# Patient Record
Sex: Male | Born: 2000 | Race: Black or African American | Hispanic: No | Marital: Single | Smoking: Never smoker
Health system: Southern US, Community
[De-identification: ages and names within clinical notes are randomized; demographics above are authoritative.]

## PROBLEM LIST (undated history)

## (undated) DIAGNOSIS — K219 Gastro-esophageal reflux disease without esophagitis: Secondary | ICD-10-CM

## (undated) HISTORY — PX: HAND SURGERY: SHX662

---
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
## 2019-11-05 ENCOUNTER — Emergency Department (HOSPITAL_COMMUNITY)
Admission: EM | Admit: 2019-11-05 | Discharge: 2019-11-05 | Disposition: A | Discharge status: Home / Self Care | Payer: Medicaid Other | Attending: Emergency Medicine | Admitting: Emergency Medicine

## 2019-11-05 ENCOUNTER — Emergency Department (HOSPITAL_COMMUNITY): Payer: Medicaid Other

## 2019-11-05 ENCOUNTER — Encounter (HOSPITAL_COMMUNITY): Payer: Self-pay

## 2019-11-05 ENCOUNTER — Other Ambulatory Visit: Payer: Self-pay

## 2019-11-05 DIAGNOSIS — S43014A Anterior dislocation of right humerus, initial encounter: Secondary | ICD-10-CM

## 2019-11-05 DIAGNOSIS — Y9389 Activity, other specified: Secondary | ICD-10-CM | POA: Diagnosis not present

## 2019-11-05 DIAGNOSIS — Y999 Unspecified external cause status: Secondary | ICD-10-CM | POA: Diagnosis not present

## 2019-11-05 DIAGNOSIS — Y929 Unspecified place or not applicable: Secondary | ICD-10-CM | POA: Insufficient documentation

## 2019-11-05 DIAGNOSIS — X509XXA Other and unspecified overexertion or strenuous movements or postures, initial encounter: Secondary | ICD-10-CM | POA: Diagnosis not present

## 2019-11-05 DIAGNOSIS — Z79899 Other long term (current) drug therapy: Secondary | ICD-10-CM | POA: Insufficient documentation

## 2019-11-05 DIAGNOSIS — S4991XA Unspecified injury of right shoulder and upper arm, initial encounter: Secondary | ICD-10-CM | POA: Diagnosis present

## 2019-11-05 MED ORDER — METHOCARBAMOL 500 MG PO TABS: 500.0000 mg | ORAL_TABLET | Freq: Two times a day (BID) | ORAL | 0 refills | Status: AC

## 2019-11-05 MED ORDER — OXYCODONE-ACETAMINOPHEN 5-325 MG PO TABS
1.0000 | ORAL_TABLET | ORAL | Status: DC | PRN
  Administered 2019-11-05: 1 via ORAL
  Filled 2019-11-05: qty 1

## 2019-11-05 NOTE — Discharge Instructions (Addendum)
Antiinflammatory medications: Take 600 mg of ibuprofen every 6 hours or 440 mg (over the counter dose) to 500 mg (prescription dose) of naproxen every 12 hours for the next 3 days. After this time, these medications may be used as needed for pain. Take these medications with food to avoid upset stomach. Choose only one of these medications, do not take them together. Acetaminophen (generic for Tylenol): Should you continue to have additional pain while taking the ibuprofen or naproxen, you may add in acetaminophen as needed. Your daily total maximum amount of acetaminophen from all sources should be limited to 4000mg /day for persons without liver problems, or 2000mg /day for those with liver problems. Methocarbamol: Methocarbamol (generic for Robaxin) is a muscle relaxer and can help relieve stiff muscles or muscle spasms.  Do not drive or perform other dangerous activities while taking this medication as it can cause drowsiness as well as changes in reaction time and judgement.  Follow-up: Follow-up with your orthopedic surgeon as soon as possible in this manner.  Call the office tomorrow morning.  Return: Return to the emergency department for numbness, weakness, redislocation, or any other major concerns.

## 2019-11-05 NOTE — ED Notes (Signed)
Patient verbalizes understanding of discharge instructions. Opportunity for questioning and answers were provided. Armband removed by staff, pt discharged from ED. Pt. ambulatory and discharged home.  

## 2019-11-05 NOTE — ED Provider Notes (Addendum)
[facility] Note   CSN: [id number] Arrival date & time: 11/05/19  1815     History   Chief Complaint Chief Complaint  Patient presents with  . Shoulder Pain    HPI Sylvia Wynona Neat Heikkila is a 18 y.o. male.     HPI   Asael Wynona Neat Norgard is a 18 y.o. male, with a history of right shoulder arthroscopy with Bankart repair, presenting to the ED with right shoulder pain.  He states he suspects his shoulder is dislocated.  This occurred around 5 PM this evening.  He was "slapping something out of the air in the shoulder got stuck." Pain is moderate, aching, nonradiating. Denies falls, numbness, weakness distally in the upper extremity, neck pain, chest pain, shortness of breath, or any other complaints.  Past Medical History:  Diagnosis Date  . Acid reflux     There are no active problems to display for this patient.   Past Surgical History:  Procedure Laterality Date  . HAND SURGERY Right    fracture  . SHOULDER ARTHROSCOPY WITH BANKART REPAIR Right 03/22/2018   Procedure: RIGHT SHOULDER ARTHROSCOPY WITH BANKART REPAIR;  Surgeon: Francena HanlySupple, Kevin, MD;  Location: [facility];  Service: Orthopedics;  Laterality: Right;        Home Medications    Prior to Admission medications   Medication Sig Start Date End Date Taking? Authorizing Provider  cyclobenzaprine (FLEXERIL) 10 MG tablet Take 1 tablet (10 mg total) by mouth 3 (three) times daily as needed for muscle spasms. 03/22/18   Shuford, French Anaracy, PA-C  methocarbamol (ROBAXIN) 500 MG tablet Take 1 tablet (500 mg total) by mouth 2 (two) times daily. 11/05/19   Joy, Shawn C, PA-C  naproxen (NAPROSYN) 500 MG tablet Take 1 tablet (500 mg total) by mouth 2 (two) times daily with a meal. 03/22/18   Shuford, French Anaracy, PA-C  omeprazole (PRILOSEC) 20 MG capsule Take 1 capsule (20 mg total) by mouth daily. 02/25/18   Georgetta HaberBurky, Natalie B, NP  oxyCODONE-acetaminophen (PERCOCET) 5-325 MG tablet Take 1 tablet by mouth every  4 (four) hours as needed. 03/22/18   Shuford, French Anaracy, PA-C    Family History No family history on file.  Social History Social History   Tobacco Use  . Smoking status: Never Smoker  . Smokeless tobacco: Never Used  Substance Use Topics  . Alcohol use: Not on file  . Drug use: Not on file     Allergies   Patient has no known allergies.   Review of Systems Review of Systems  Respiratory: Negative for shortness of breath.   Cardiovascular: Negative for chest pain.  Musculoskeletal: Positive for arthralgias. Negative for neck pain.  Neurological: Negative for weakness and numbness.     Physical Exam Updated Vital Signs BP 137/85 (BP Location: Left Arm)   Pulse 69   Temp 98.5 F (36.9 C) (Oral)   Resp 18   Ht 6' (1.829 m)   Wt 78.9 kg   SpO2 100%   BMI 23.60 kg/m   Physical Exam Vitals signs and nursing note reviewed.  Constitutional:      General: He is not in acute distress.    Appearance: He is well-developed. He is not diaphoretic.  HENT:     Head: Normocephalic and atraumatic.  Eyes:     Conjunctiva/sclera: Conjunctivae normal.  Neck:     Musculoskeletal: Neck supple.  Cardiovascular:     Rate and Rhythm: Normal rate and regular rhythm.  Pulses:          Radial pulses are 2+ on the right side.  Pulmonary:     Effort: Pulmonary effort is normal.  Musculoskeletal:     Comments: Patient is holding his right arm up in the air with his shoulder abducted 90 degrees.  He has deformity noted in the right shoulder consistent with shoulder dislocation. He has full range of motion in the right elbow and wrist without difficulty or pain.   Skin:    General: Skin is warm and dry.     Capillary Refill: Capillary refill takes less than 2 seconds.     Coloration: Skin is not pale.  Neurological:     Mental Status: He is alert.     Comments: Sensation to light touch grossly intact in the right upper extremity down into the fingers. Grip strength equal  bilaterally. Strength 5/5 in the right wrist and elbow.  Neurologic testing reperformed after reduction and continues to be neurologically intact.  Psychiatric:        Behavior: Behavior normal.      ED Treatments / Results  Labs (all labs ordered are listed, but only abnormal results are displayed) Labs Reviewed - No data to display  EKG None  Radiology Dg Shoulder Right  Result Date: 11/05/2019 CLINICAL DATA:  Post reduction following dislocation of the right shoulder. EXAM: RIGHT SHOULDER - 2+ VIEW COMPARISON:  11/05/2019 FINDINGS: Right shoulder is now located. There is irregularity of the inferior rim of the glenoid acromioclavicular joint is intact. IMPRESSION: Right shoulder is now located. Irregularity of the inferior glenoid is consistent with mildly displaced glenoid fracture, CT may be helpful for further assessment as clinically indicated. Electronically Signed   By: Donzetta Kohut M.D.   On: 11/05/2019 22:05   Dg Shoulder Right  Result Date: 11/05/2019 CLINICAL DATA:  Possible dislocation EXAM: RIGHT SHOULDER - 2+ VIEW COMPARISON:  None. FINDINGS: There is an anterior inferior dislocation of the humeral head. There is a cystic lucency seen in the inferior glenoid surface which could be from a prior Bankart lesion. Overlying soft tissue swelling. The [facility] joint appears to be intact. IMPRESSION: Anterior inferior dislocation of the humeral head. Electronically Signed   By: Jonna Clark M.D.   On: 11/05/2019 19:36    Procedures Reduction of dislocation  Date/Time: 11/05/2019 9:28 PM Performed by: Anselm Pancoast, PA-C Authorized by: Anselm Pancoast, PA-C  Consent: Verbal consent obtained. Risks and benefits: risks, benefits and alternatives were discussed Consent given by: patient Patient understanding: patient states understanding of the procedure being performed Patient identity confirmed: verbally with patient and provided demographic data Local anesthesia used: no   Anesthesia: Local anesthesia used: no  Sedation: Patient sedated: no  Patient tolerance: patient tolerated the procedure well with no immediate complications Comments: Right shoulder reduction    (including critical care time)  Medications Ordered in ED Medications  oxyCODONE-acetaminophen (PERCOCET/ROXICET) 5-325 MG per tablet 1 tablet (1 tablet Oral Given 11/05/19 1845)     Initial Impression / Assessment and Plan / ED Course  I have reviewed the triage vital signs and the nursing notes.  Pertinent labs & imaging results that were available during my care of the patient were reviewed by me and considered in my medical decision making (see chart for details).  Clinical Course as of Nov 04 2328  Tue Nov 05, 2019  2055 Patient not yet in the room.   [SJ]  2210 Spoke with Dr. Victorino Dike, orthopedic  surgeon on call.  We discussed patient's presentation and suspicion for possible glenoid fracture on postreduction x-ray.  Dr. Doran Durand reviewed the x-ray himself and states the noted findings are more consistent with postsurgical changes.  He advises holding off on CT at this time and have patient follow-up in the office with Dr. Onnie Graham.   [SJ]    Clinical Course User Index [SJ] Joy, Shawn C, PA-C       Patient presents with right shoulder pain.  No evidence of neurovascular compromise.  Found to have anterior shoulder dislocation. Patient given the option for further pain management prior to reduction, but declined. Dislocation reduced at the bedside without immediate complication, confirmed successful reduction with follow-up x-ray. Orthopedic follow-up. The patient was given instructions for home care as well as return precautions. Patient voices understanding of these instructions, accepts the plan, and is comfortable with discharge.  Findings and plan of care discussed with Shirlyn Goltz, MD.   Final Clinical Impressions(s) / ED Diagnoses   Final diagnoses:  Anterior dislocation of  right shoulder, initial encounter    ED Discharge Orders         Ordered    methocarbamol (ROBAXIN) 500 MG tablet  2 times daily     11/05/19 2217           Layla Maw 11/05/19 2338    Lorayne Bender, PA-C 11/05/19 2338    Drenda Freeze, MD 11/06/19 [phone]

## 2019-11-05 NOTE — ED Triage Notes (Signed)
Pt reports right shoulder dislocation from raising his arm up, hx of shoulder surgery in aug of this year due to dislocation, pt unable to lay arm down due to the pain.

## 2021-04-10 IMAGING — DX DG SHOULDER 2+V*R*
2 series · 2 of 2 positions shown · non-contrast
Comparison: 11/05/2019

CLINICAL DATA: Post reduction following dislocation of the right
shoulder.

EXAM:
RIGHT SHOULDER - 2+ VIEW

[shoulder y view]
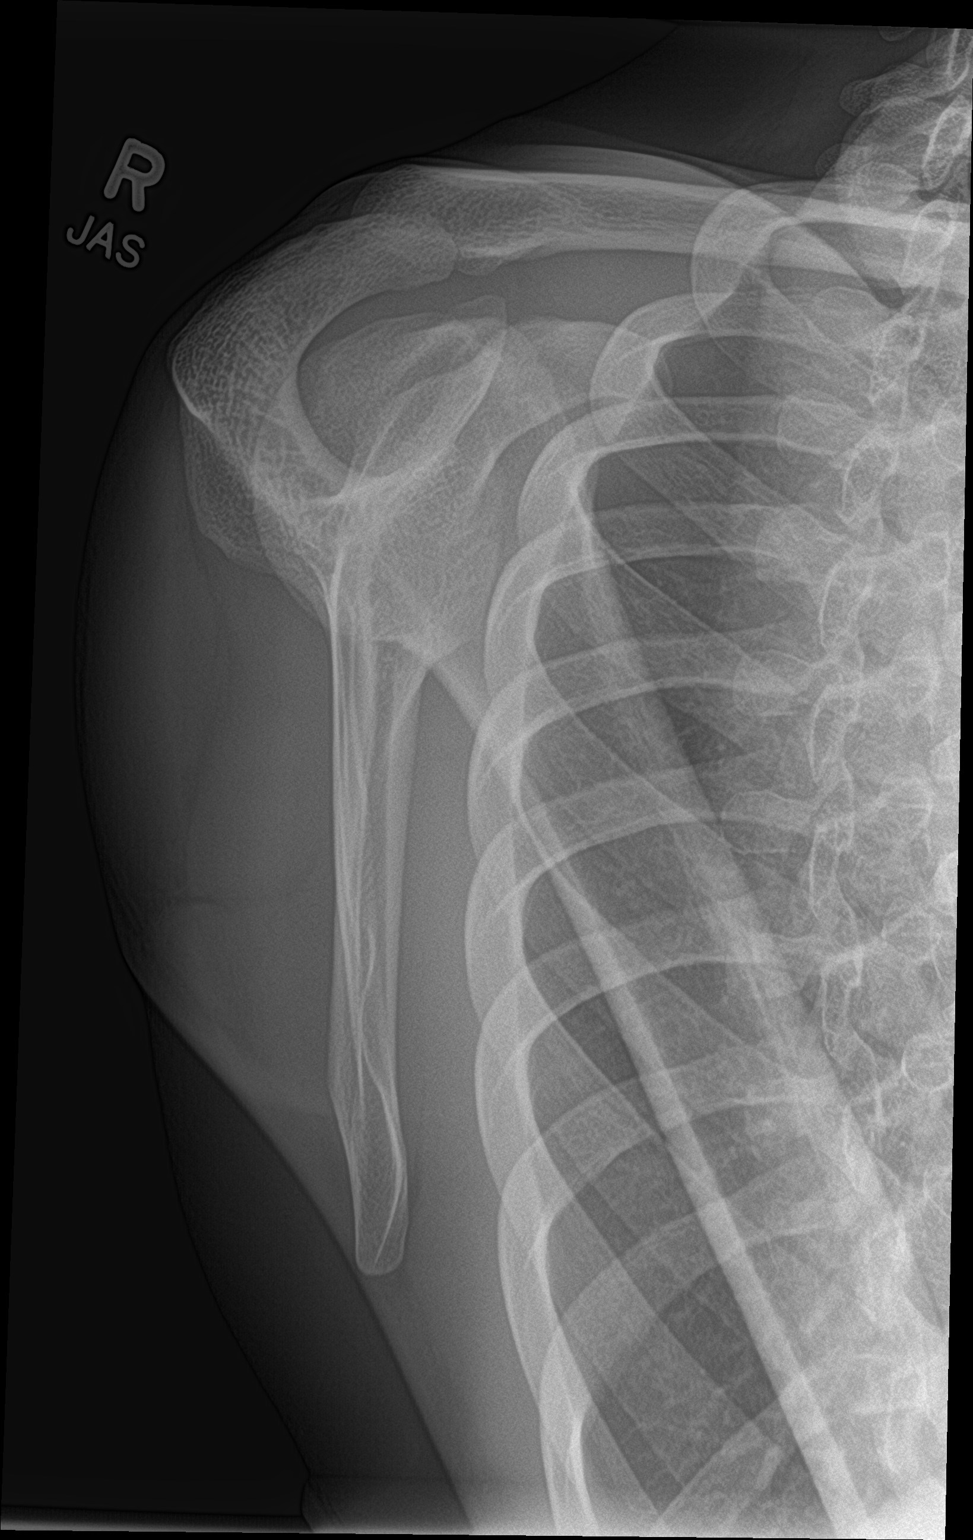

[shoulder ap neutral]
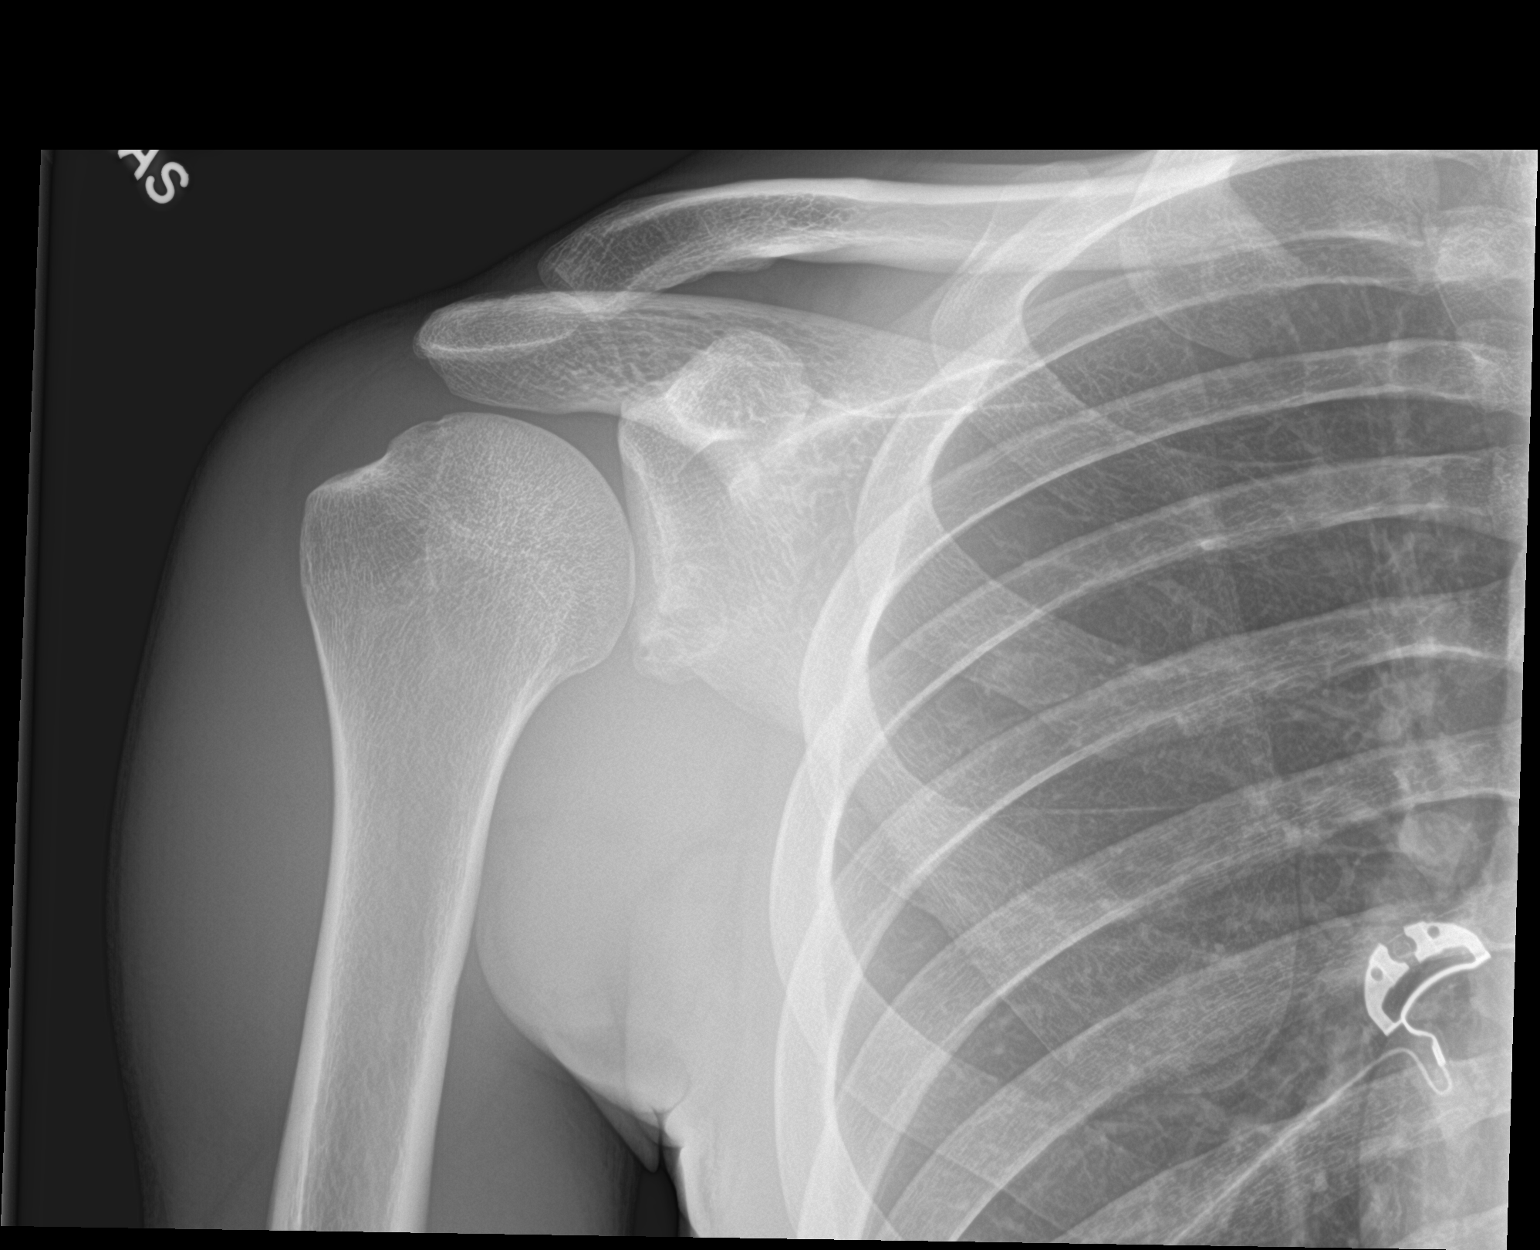

[2 of 2 positions shown; findings below may reference images not displayed]

FINDINGS: Right shoulder is now located. There is irregularity of the inferior
rim of the glenoid acromioclavicular joint is intact.
IMPRESSION: Right shoulder is now located. Irregularity of the inferior glenoid
is consistent with mildly displaced glenoid fracture, CT may be
helpful for further assessment as clinically indicated.

## 2021-04-10 IMAGING — DX DG SHOULDER 2+V*R*
2 series · 2 of 2 positions shown · non-contrast
Comparison: None.

CLINICAL DATA: Possible dislocation

EXAM:
RIGHT SHOULDER - 2+ VIEW

[shoulder grashey]
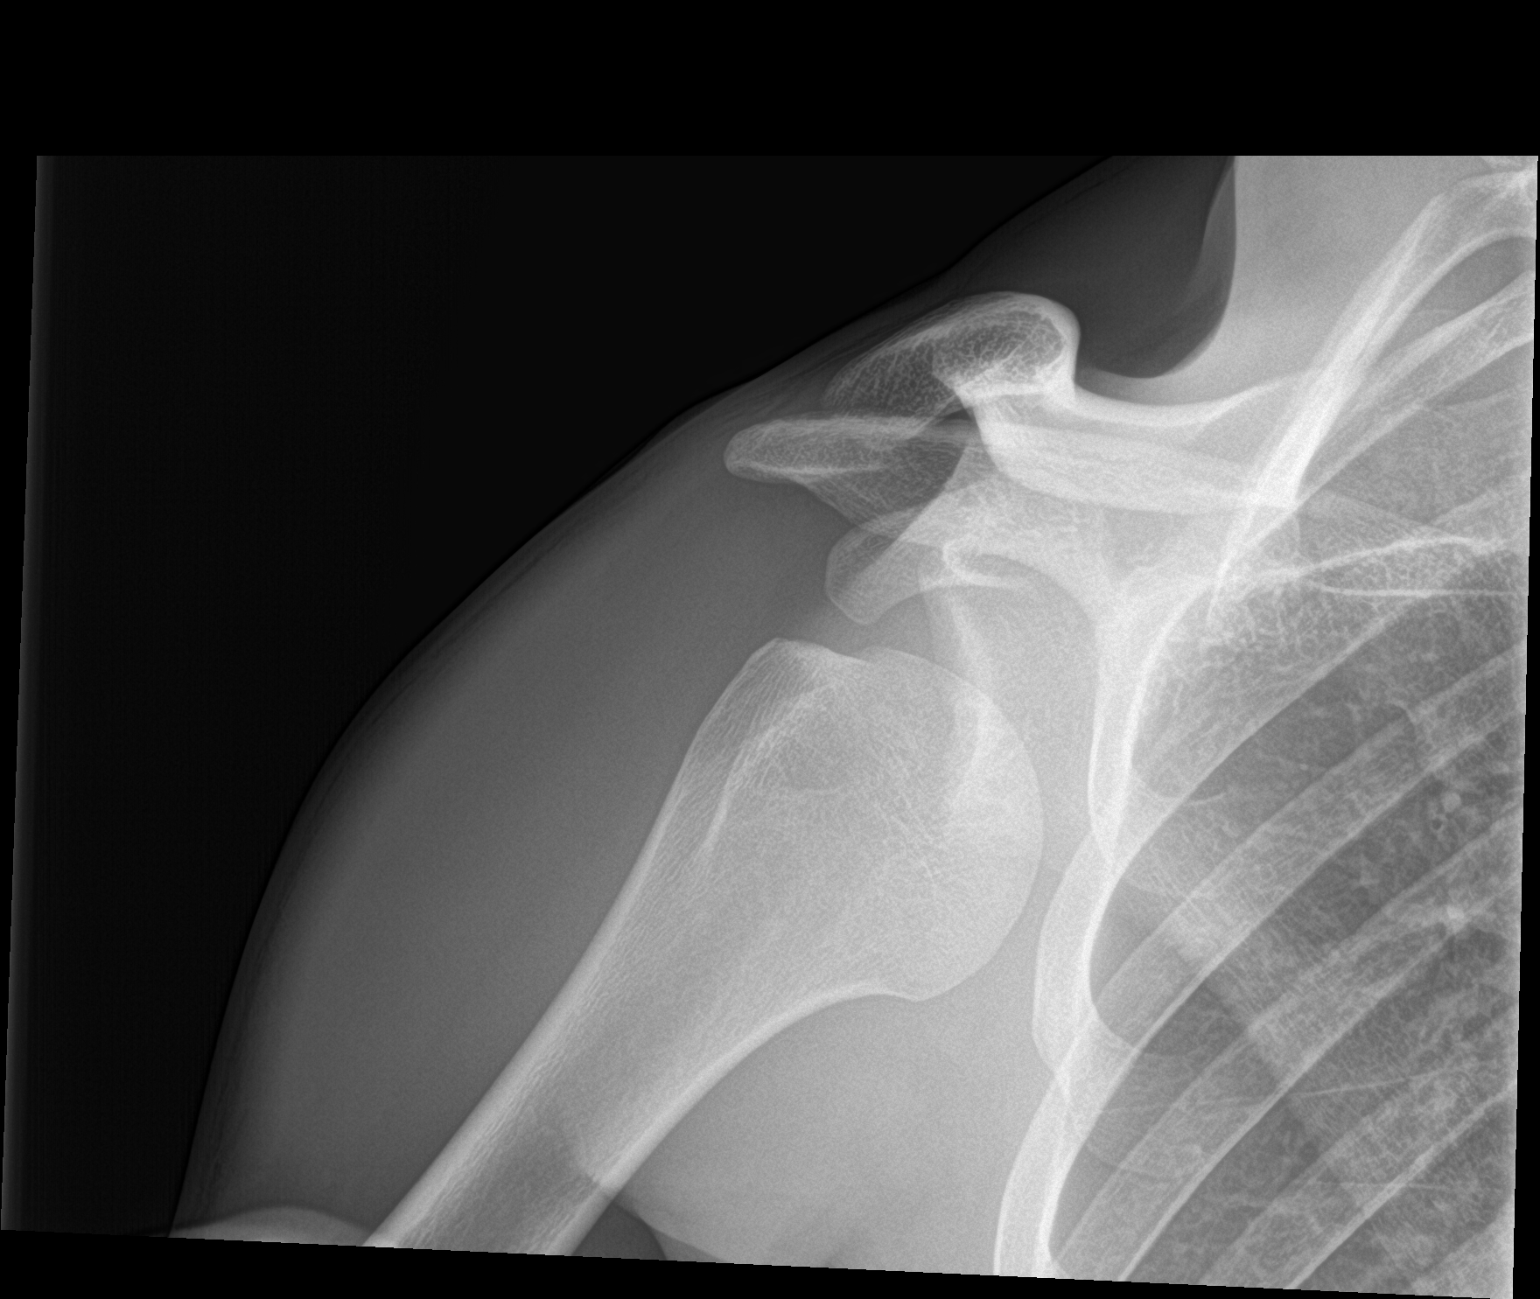

[shoulder y view]
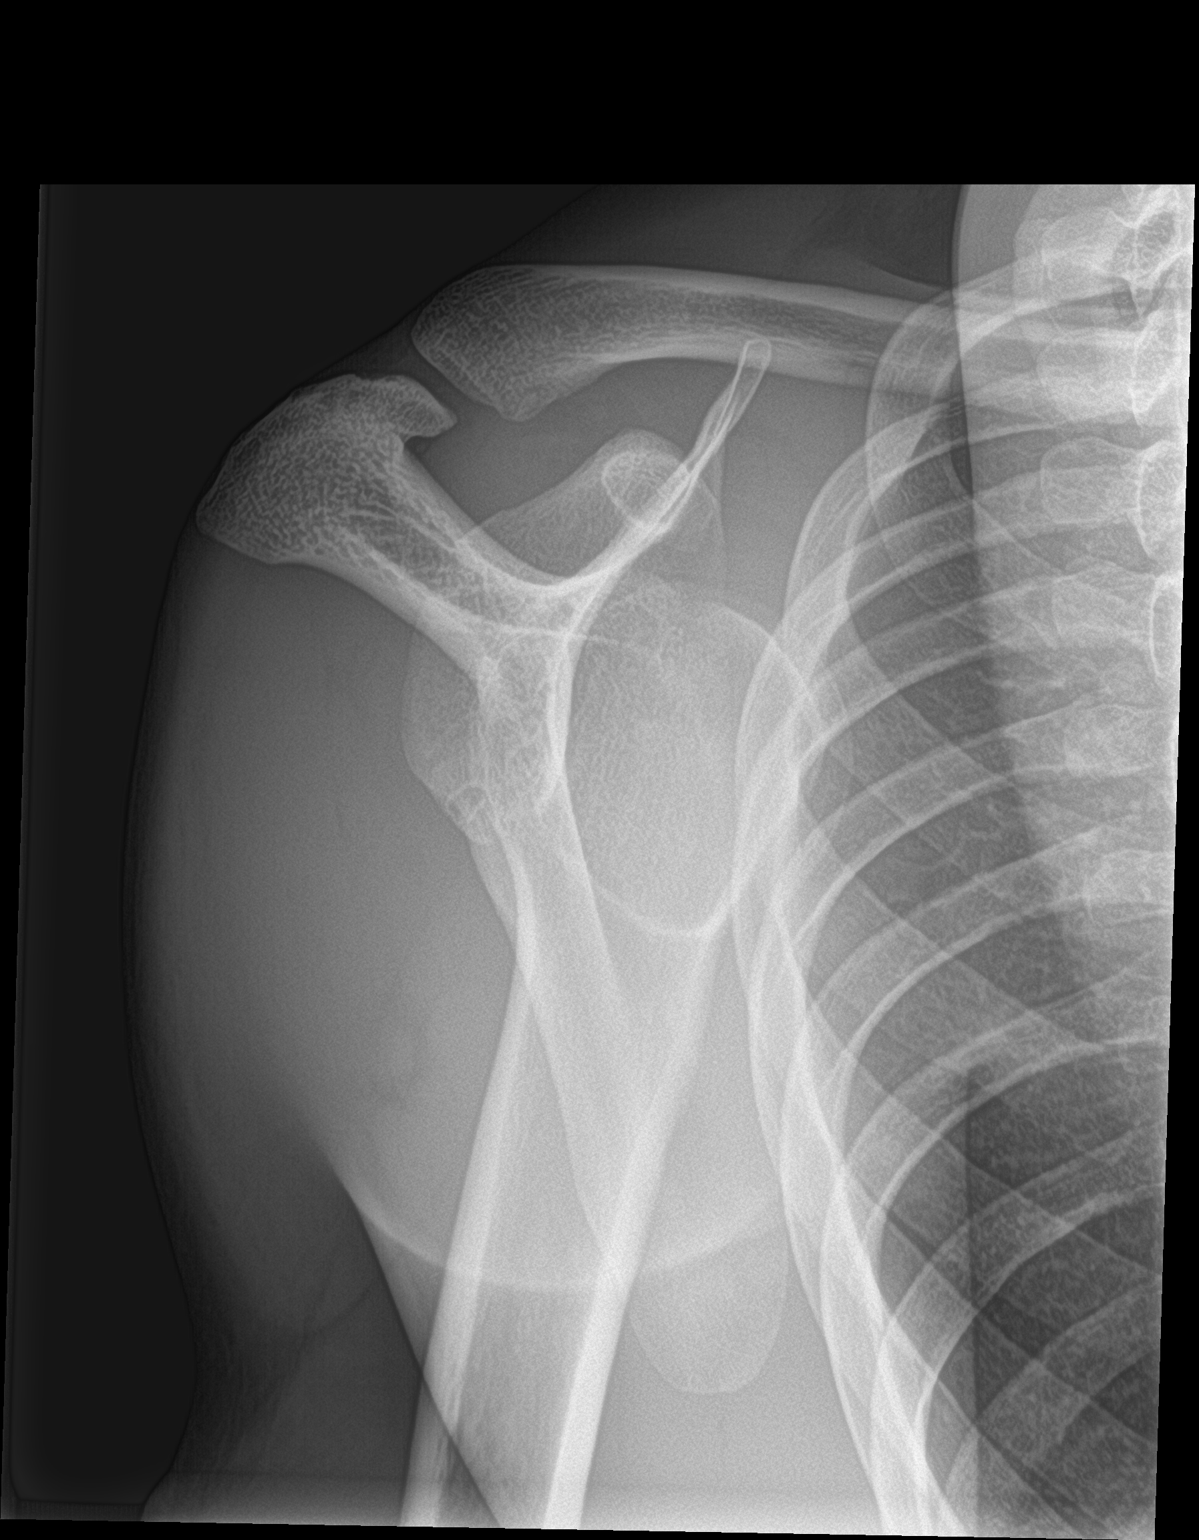

[2 of 2 positions shown; findings below may reference images not displayed]

FINDINGS: There is an anterior inferior dislocation of the humeral head. There
is a cystic lucency seen in the inferior glenoid surface which could
be from a prior Bankart lesion. Overlying soft tissue swelling. The
AC joint appears to be intact.
IMPRESSION: Anterior inferior dislocation of the humeral head.

## 2022-07-29 ENCOUNTER — Ambulatory Visit (HOSPITAL_COMMUNITY)
Admission: EM | Admit: 2022-07-29 | Discharge: 2022-07-29 | Disposition: A | Discharge status: Home / Self Care | Payer: Medicaid Other | Attending: Family Medicine | Admitting: Family Medicine

## 2022-07-29 ENCOUNTER — Encounter (HOSPITAL_COMMUNITY): Payer: Self-pay

## 2022-07-29 DIAGNOSIS — R369 Urethral discharge, unspecified: Secondary | ICD-10-CM | POA: Diagnosis present

## 2022-07-29 DIAGNOSIS — Z113 Encounter for screening for infections with a predominantly sexual mode of transmission: Secondary | ICD-10-CM

## 2022-07-29 LAB — POCT URINALYSIS DIPSTICK, ED / UC
Glucose, UA: NEGATIVE mg/dL
Ketones, ur: >=160 mg/dL — AB
Nitrite: NEGATIVE
Protein, ur: 100 mg/dL — AB
Specific Gravity, Urine: >=1.03 (ref 1.005–1.030)
Urobilinogen, UA: 1 mg/dL (ref 0.0–1.0)
pH: 5.5 (ref 5.0–8.0)

## 2022-07-29 LAB — HEPATITIS C ANTIBODY: HCV Ab: NONREACTIVE

## 2022-07-29 LAB — HIV ANTIBODY (ROUTINE TESTING W REFLEX): HIV Screen 4th Generation wRfx: NONREACTIVE

## 2022-07-29 MED ORDER — CEFTRIAXONE SODIUM 500 MG IJ SOLR
500.0000 mg | Freq: Once | INTRAMUSCULAR | Status: AC
  Administered 2022-07-29: 500 mg via INTRAMUSCULAR

## 2022-07-29 MED ORDER — DOXYCYCLINE HYCLATE 100 MG PO CAPS: 100.0000 mg | ORAL_CAPSULE | Freq: Two times a day (BID) | ORAL | 0 refills | Status: AC

## 2022-07-29 MED ORDER — LIDOCAINE HCL (PF) 1 % IJ SOLN
INTRAMUSCULAR | Status: AC
  Filled 2022-07-29: qty 2

## 2022-07-29 MED ORDER — CEFTRIAXONE SODIUM 500 MG IJ SOLR
INTRAMUSCULAR | Status: AC
  Filled 2022-07-29: qty 500

## 2022-07-29 NOTE — ED Triage Notes (Signed)
Pt has been having discharge from the penis color white x4days. Mild burning sensation when urinating.

## 2022-07-29 NOTE — Discharge Instructions (Signed)
You were seen today for penile discharge.  You were treated today with a shot of rocephin, and sent out antibiotic twice/day x 7 days.  Your swab and lab work will be resulted tomorrow.  Please avoid intercourse for a week while on treatment.  Please discuss you results with your partner when available.

## 2022-07-29 NOTE — ED Provider Notes (Signed)
[facility]    CSN: [id number] Arrival date & time: 07/29/22  0935      History   Chief Complaint Chief Complaint  Patient presents with   Penile Discharge    HPI Bobby Hansen is a 21 y.o. male.   Patient is here for white penile d/c for at least 4 days.  Slight painful urination.  No abd pain, no frequency.  No fevers/chills.  Sexually active 1 partner.   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  No sores/lesions noted.   Past Medical History:  Diagnosis Date   Acid reflux     There are no problems to display for this patient.   Past Surgical History:  Procedure Laterality Date   HAND SURGERY Right    fracture   SHOULDER ARTHROSCOPY WITH BANKART REPAIR Right 03/22/2018   Procedure: RIGHT SHOULDER ARTHROSCOPY WITH BANKART REPAIR;  Surgeon: Francena Hanly, MD;  Location: [facility] OR;  Service: Orthopedics;  Laterality: Right;       Home Medications    Prior to Admission medications   Medication Sig Start Date End Date Taking? Authorizing Provider  cyclobenzaprine (FLEXERIL) 10 MG tablet Take 1 tablet (10 mg total) by mouth 3 (three) times daily as needed for muscle spasms. 03/22/18   Shuford, French Ana, PA-C  methocarbamol (ROBAXIN) 500 MG tablet Take 1 tablet (500 mg total) by mouth 2 (two) times daily. 11/05/19   Joy, Shawn C, PA-C  naproxen (NAPROSYN) 500 MG tablet Take 1 tablet (500 mg total) by mouth 2 (two) times daily with a meal. 03/22/18   Shuford, French Ana, PA-C  omeprazole (PRILOSEC) 20 MG capsule Take 1 capsule (20 mg total) by mouth daily. 02/25/18   Georgetta Haber, NP  oxyCODONE-acetaminophen (PERCOCET) 5-325 MG tablet Take 1 tablet by mouth every 4 (four) hours as needed. 03/22/18   Shuford, French Ana, PA-C    Family History History reviewed. No pertinent family history.  Social History Social History   Tobacco Use   Smoking status: Never   Smokeless tobacco: Never     Allergies   Patient has no known allergies.   Review of  Systems Review of Systems  Constitutional: Negative.   HENT: Negative.    Respiratory: Negative.    Cardiovascular: Negative.   Gastrointestinal: Negative.   Genitourinary:  Positive for penile discharge. Negative for dysuria, frequency, testicular pain and urgency.     Physical Exam Triage Vital Signs ED Triage Vitals  Enc Vitals Group     BP 07/29/22 0945 134/80     Pulse Rate 07/29/22 0945 77     Resp 07/29/22 0945 12     Temp 07/29/22 0945 98 F (36.7 C)     Temp Source 07/29/22 0945 Oral     SpO2 --      Weight 07/29/22 0947 175 lb (79.4 kg)     Height 07/29/22 0947 6' (1.829 m)     Head Circumference --      Peak Flow --      Pain Score 07/29/22 0947 0     Pain Loc --      Pain Edu? --      Excl. in GC? --    No data found.  Updated Vital Signs BP 134/80 (BP Location: Left Arm)   Pulse 77   Temp 98 F (36.7 C) (Oral)   Resp 12   Ht 6' (1.829 m)   Wt 79.4 kg   BMI 23.73 kg/m   Visual Acuity  Right Eye Distance:   Left Eye Distance:   Bilateral Distance:    Right Eye Near:   Left Eye Near:    Bilateral Near:     Physical Exam Constitutional:      Appearance: Normal appearance.  Cardiovascular:     Rate and Rhythm: Normal rate and regular rhythm.  Pulmonary:     Effort: Pulmonary effort is normal.     Breath sounds: Normal breath sounds.  Abdominal:     Palpations: Abdomen is soft.  Musculoskeletal:     Cervical back: Normal range of motion.  Neurological:     Mental Status: He is alert.      UC Treatments / Results  Labs (all labs ordered are listed, but only abnormal results are displayed) Labs Reviewed  POCT URINALYSIS DIPSTICK, ED / UC  CYTOLOGY, (ORAL, ANAL, URETHRAL) ANCILLARY ONLY    EKG   Radiology No results found.  Procedures Procedures (including critical care time)  Medications Ordered in UC Medications  cefTRIAXone (ROCEPHIN) injection 500 mg (has no administration in time range)    Initial Impression /  Assessment and Plan / UC Course  I have reviewed the triage vital signs and the nursing notes.  Pertinent labs & imaging results that were available during my care of the patient were reviewed by me and considered in my medical decision making (see chart for details).   Final Clinical Impressions(s) / UC Diagnoses   Final diagnoses:  Penile discharge  Screening examination for STD (sexually transmitted disease)     Discharge Instructions      You were seen today for penile discharge.  You were treated today with a shot of rocephin, and sent out antibiotic twice/day x 7 days.  Your swab and lab work will be resulted tomorrow.  Please avoid intercourse for a week while on treatment.  Please discuss you results with your partner when available.     ED Prescriptions     Medication Sig Dispense Auth. Provider   doxycycline (VIBRAMYCIN) 100 MG capsule Take 1 capsule (100 mg total) by mouth 2 (two) times daily for 7 days. 14 capsule Jannifer Franklin, MD      PDMP not reviewed this encounter.   Jannifer Franklin, MD 07/29/22 1008

## 2022-07-30 LAB — URINE CULTURE: Culture: NO GROWTH

## 2022-07-30 LAB — RPR: RPR Ser Ql: NONREACTIVE

## 2022-08-01 LAB — CYTOLOGY, (ORAL, ANAL, URETHRAL) ANCILLARY ONLY
Chlamydia: NEGATIVE
Comment: NEGATIVE
Comment: NEGATIVE
Comment: NORMAL
Neisseria Gonorrhea: POSITIVE — AB
Trichomonas: NEGATIVE

## 2022-11-30 ENCOUNTER — Other Ambulatory Visit: Payer: Self-pay

## 2022-11-30 ENCOUNTER — Observation Stay (HOSPITAL_COMMUNITY)
Admission: EM | Admit: 2022-11-30 | Discharge: 2022-12-02 | Disposition: A | Discharge status: Home / Self Care | Payer: Medicaid Other | Attending: Emergency Medicine | Admitting: Emergency Medicine

## 2022-11-30 ENCOUNTER — Emergency Department (HOSPITAL_COMMUNITY): Payer: Medicaid Other

## 2022-11-30 ENCOUNTER — Encounter (HOSPITAL_COMMUNITY): Payer: Self-pay

## 2022-11-30 DIAGNOSIS — T07XXXA Unspecified multiple injuries, initial encounter: Secondary | ICD-10-CM

## 2022-11-30 DIAGNOSIS — S36113A Laceration of liver, unspecified degree, initial encounter: Secondary | ICD-10-CM | POA: Insufficient documentation

## 2022-11-30 DIAGNOSIS — S22089A Unspecified fracture of T11-T12 vertebra, initial encounter for closed fracture: Secondary | ICD-10-CM | POA: Diagnosis not present

## 2022-11-30 DIAGNOSIS — S27331A Laceration of lung, unilateral, initial encounter: Principal | ICD-10-CM | POA: Insufficient documentation

## 2022-11-30 DIAGNOSIS — Z23 Encounter for immunization: Secondary | ICD-10-CM | POA: Insufficient documentation

## 2022-11-30 DIAGNOSIS — K683 Retroperitoneal hematoma: Secondary | ICD-10-CM | POA: Insufficient documentation

## 2022-11-30 DIAGNOSIS — Z79899 Other long term (current) drug therapy: Secondary | ICD-10-CM | POA: Diagnosis not present

## 2022-11-30 DIAGNOSIS — J939 Pneumothorax, unspecified: Secondary | ICD-10-CM

## 2022-11-30 LAB — PROTIME-INR
INR: 1.1 (ref 0.8–1.2)
Prothrombin Time: 13.6 seconds (ref 11.4–15.2)

## 2022-11-30 LAB — I-STAT CHEM 8, ED
BUN: 12 mg/dL (ref 6–20)
Calcium, Ion: 0.99 mmol/L — ABNORMAL LOW (ref 1.15–1.40)
Chloride: 107 mmol/L (ref 98–111)
Creatinine, Ser: 1.3 mg/dL — ABNORMAL HIGH (ref 0.61–1.24)
Glucose, Bld: 127 mg/dL — ABNORMAL HIGH (ref 70–99)
HCT: 40 % (ref 39.0–52.0)
Hemoglobin: 13.6 g/dL (ref 13.0–17.0)
Potassium: 3.1 mmol/L — ABNORMAL LOW (ref 3.5–5.1)
Sodium: 140 mmol/L (ref 135–145)
TCO2: 16 mmol/L — ABNORMAL LOW (ref 22–32)

## 2022-11-30 LAB — SAMPLE TO BLOOD BANK

## 2022-11-30 MED ORDER — SODIUM CHLORIDE 0.9 % IV SOLN
1000.0000 mL | INTRAVENOUS | Status: DC
  Administered 2022-12-01: 1000 mL via INTRAVENOUS

## 2022-11-30 MED ORDER — ONDANSETRON 4 MG PO TBDP: 4.0000 mg | ORAL_TABLET | Freq: Four times a day (QID) | ORAL | Status: DC | PRN

## 2022-11-30 MED ORDER — ENOXAPARIN SODIUM 30 MG/0.3ML IJ SOSY
30.0000 mg | PREFILLED_SYRINGE | Freq: Two times a day (BID) | INTRAMUSCULAR | Status: DC
  Administered 2022-12-01 – 2022-12-02 (×3): 30 mg via SUBCUTANEOUS
  Filled 2022-11-30 (×3): qty 0.3

## 2022-11-30 MED ORDER — SODIUM CHLORIDE 0.9 % IV BOLUS (SEPSIS)
1000.0000 mL | Freq: Once | INTRAVENOUS | Status: AC
  Administered 2022-11-30: 1000 mL via INTRAVENOUS

## 2022-11-30 MED ORDER — ONDANSETRON HCL 4 MG/2ML IJ SOLN: 4.0000 mg | Freq: Four times a day (QID) | INTRAMUSCULAR | Status: DC | PRN

## 2022-11-30 MED ORDER — SODIUM CHLORIDE 0.9 % IV SOLN: INTRAVENOUS | Status: DC

## 2022-11-30 MED ORDER — OXYCODONE HCL 5 MG PO TABS
10.0000 mg | ORAL_TABLET | ORAL | Status: DC | PRN
  Administered 2022-12-01: 10 mg via ORAL
  Filled 2022-11-30: qty 2

## 2022-11-30 MED ORDER — IOHEXOL 350 MG/ML SOLN
75.0000 mL | Freq: Once | INTRAVENOUS | Status: AC | PRN
  Administered 2022-11-30: 75 mL via INTRAVENOUS

## 2022-11-30 MED ORDER — HYDROMORPHONE HCL 1 MG/ML IJ SOLN
1.0000 mg | INTRAMUSCULAR | Status: DC | PRN
  Administered 2022-12-01: 1 mg via INTRAVENOUS

## 2022-11-30 MED ORDER — SODIUM CHLORIDE 0.9 % IV BOLUS (SEPSIS)
1000.0000 mL | Freq: Once | INTRAVENOUS | Status: AC
  Administered 2022-12-01: 1000 mL via INTRAVENOUS

## 2022-11-30 MED ORDER — TETANUS-DIPHTH-ACELL PERTUSSIS 5-2.5-18.5 LF-MCG/0.5 IM SUSY
0.5000 mL | PREFILLED_SYRINGE | Freq: Once | INTRAMUSCULAR | Status: AC
  Administered 2022-11-30: 0.5 mL via INTRAMUSCULAR
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
## 2022-11-30 NOTE — ED Notes (Signed)
The pt is alert and talking no allergies 2 ivs  x 2 lt  forearm 18 and a 16
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
## 2022-12-01 ENCOUNTER — Observation Stay (HOSPITAL_COMMUNITY): Payer: Medicaid Other

## 2022-12-01 LAB — URINALYSIS, ROUTINE W REFLEX MICROSCOPIC
Bacteria, UA: NONE SEEN
Bilirubin Urine: NEGATIVE
Glucose, UA: NEGATIVE mg/dL
Hgb urine dipstick: NEGATIVE
Ketones, ur: 5 mg/dL — AB
Leukocytes,Ua: NEGATIVE
Nitrite: NEGATIVE
Protein, ur: NEGATIVE mg/dL
Specific Gravity, Urine: 1.041 — ABNORMAL HIGH (ref 1.005–1.030)
pH: 5 (ref 5.0–8.0)

## 2022-12-01 LAB — COMPREHENSIVE METABOLIC PANEL
ALT: 38 U/L (ref 0–44)
AST: 56 U/L — ABNORMAL HIGH (ref 15–41)
Albumin: 4 g/dL (ref 3.5–5.0)
Alkaline Phosphatase: 57 U/L (ref 38–126)
Anion gap: 19 — ABNORMAL HIGH (ref 5–15)
BUN: 12 mg/dL (ref 6–20)
CO2: 16 mmol/L — ABNORMAL LOW (ref 22–32)
Calcium: 8.4 mg/dL — ABNORMAL LOW (ref 8.9–10.3)
Chloride: 106 mmol/L (ref 98–111)
Creatinine, Ser: 1.33 mg/dL — ABNORMAL HIGH (ref 0.61–1.24)
GFR, Estimated: >60 mL/min (ref >60)
Glucose, Bld: 131 mg/dL — ABNORMAL HIGH (ref 70–99)
Potassium: 3.1 mmol/L — ABNORMAL LOW (ref 3.5–5.1)
Sodium: 141 mmol/L (ref 135–145)
Total Bilirubin: 0.6 mg/dL (ref 0.3–1.2)
Total Protein: 6.4 g/dL — ABNORMAL LOW (ref 6.5–8.1)

## 2022-12-01 LAB — CBC
HCT: 38.6 % — ABNORMAL LOW (ref 39.0–52.0)
HCT: 30.8 % — ABNORMAL LOW (ref 39.0–52.0)
HCT: 29.5 % — ABNORMAL LOW (ref 39.0–52.0)
Hemoglobin: 12.8 g/dL — ABNORMAL LOW (ref 13.0–17.0)
Hemoglobin: 10.3 g/dL — ABNORMAL LOW (ref 13.0–17.0)
Hemoglobin: 9.9 g/dL — ABNORMAL LOW (ref 13.0–17.0)
MCH: 32.9 pg (ref 26.0–34.0)
MCH: 32.3 pg (ref 26.0–34.0)
MCH: 32.2 pg (ref 26.0–34.0)
MCHC: 33.2 g/dL (ref 30.0–36.0)
MCHC: 33.4 g/dL (ref 30.0–36.0)
MCHC: 33.6 g/dL (ref 30.0–36.0)
MCV: 99.2 fL (ref 80.0–100.0)
MCV: 96.6 fL (ref 80.0–100.0)
MCV: 96.1 fL (ref 80.0–100.0)
Platelets: 304 10*3/uL (ref 150–400)
Platelets: 242 10*3/uL (ref 150–400)
Platelets: 231 10*3/uL (ref 150–400)
RBC: 3.89 MIL/uL — ABNORMAL LOW (ref 4.22–5.81)
RBC: 3.19 MIL/uL — ABNORMAL LOW (ref 4.22–5.81)
RBC: 3.07 MIL/uL — ABNORMAL LOW (ref 4.22–5.81)
RDW: 12.9 % (ref 11.5–15.5)
RDW: 12.7 % (ref 11.5–15.5)
RDW: 12.6 % (ref 11.5–15.5)
WBC: 9.6 10*3/uL (ref 4.0–10.5)
WBC: 15.2 10*3/uL — ABNORMAL HIGH (ref 4.0–10.5)
WBC: 12.6 10*3/uL — ABNORMAL HIGH (ref 4.0–10.5)
nRBC: 0 % (ref 0.0–0.2)
nRBC: 0 % (ref 0.0–0.2)
nRBC: 0 % (ref 0.0–0.2)

## 2022-12-01 LAB — ETHANOL: Alcohol, Ethyl (B): 145 mg/dL — ABNORMAL HIGH (ref <10)

## 2022-12-01 LAB — LACTIC ACID, PLASMA: Lactic Acid, Venous: 8.1 mmol/L (ref 0.5–1.9)

## 2022-12-01 LAB — HIV ANTIBODY (ROUTINE TESTING W REFLEX): HIV Screen 4th Generation wRfx: NONREACTIVE

## 2022-12-01 MED ORDER — HYDROMORPHONE HCL 1 MG/ML IJ SOLN: 1.0000 mg | INTRAMUSCULAR | Status: DC | PRN

## 2022-12-01 MED ORDER — OXYCODONE HCL 5 MG PO TABS
5.0000 mg | ORAL_TABLET | ORAL | Status: DC | PRN
  Administered 2022-12-01 – 2022-12-02 (×2): 10 mg via ORAL
  Administered 2022-12-02: 5 mg via ORAL
  Filled 2022-12-01: qty 1
  Filled 2022-12-01 (×2): qty 2

## 2022-12-01 MED ORDER — HYDROMORPHONE HCL 1 MG/ML IJ SOLN
INTRAMUSCULAR | Status: AC
  Filled 2022-12-01: qty 1

## 2022-12-01 MED ORDER — IOHEXOL 350 MG/ML SOLN
75.0000 mL | Freq: Once | INTRAVENOUS | Status: AC | PRN
  Administered 2022-12-01: 75 mL via INTRAVENOUS

## 2022-12-01 MED ORDER — ACETAMINOPHEN 325 MG PO TABS
650.0000 mg | ORAL_TABLET | Freq: Four times a day (QID) | ORAL | Status: DC
  Administered 2022-12-01 – 2022-12-02 (×6): 650 mg via ORAL
  Filled 2022-12-01 (×6): qty 2

## 2022-12-01 MED ORDER — LIDOCAINE-EPINEPHRINE (PF) 2 %-1:200000 IJ SOLN
10.0000 mL | Freq: Once | INTRAMUSCULAR | Status: AC
  Administered 2022-12-01: 10 mL via INTRADERMAL
  Filled 2022-12-01: qty 20

## 2022-12-01 MED ORDER — IOHEXOL 300 MG/ML  SOLN
100.0000 mL | Freq: Once | INTRAMUSCULAR | Status: AC | PRN
  Administered 2022-12-01: 100 mL

## 2022-12-01 MED ORDER — METHOCARBAMOL 500 MG PO TABS
500.0000 mg | ORAL_TABLET | Freq: Three times a day (TID) | ORAL | Status: DC
  Administered 2022-12-01 – 2022-12-02 (×4): 500 mg via ORAL
  Filled 2022-12-01 (×4): qty 1
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
## 2022-12-01 NOTE — Progress Notes (Signed)
Orthopedic Tech Progress Note Patient Details:  Bobby Hansen [birth year] [id number]  Patient ID: Bobby Hansen, male   DOB: [birth year], 21 y.o.   MRN: [id number] Level I; not currently needed. Bobby Hansen 12/01/2022, 12:15 AM
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
## 2022-12-01 NOTE — ED Provider Notes (Signed)
..Laceration Repair  Date/Time: 12/01/2022 3:07 AM  Performed by: Netta Corrigan, PA-C Authorized by: Netta Corrigan, PA-C   Consent:    Consent obtained:  Verbal   Consent given by:  Patient and parent   Risks, benefits, and alternatives were discussed: yes     Risks discussed:  Infection, pain, retained foreign body, tendon damage, poor cosmetic result, need for additional repair, nerve damage, poor wound healing and vascular damage   Alternatives discussed:  No treatment, delayed treatment, observation and referral Universal protocol:    Procedure explained and questions answered to patient or proxy's satisfaction: yes     Relevant documents present and verified: yes     Test results available: yes     Imaging studies available: yes     Patient identity confirmed:  Verbally with patient and arm band Anesthesia:    Anesthesia method:  Local infiltration   Local anesthetic:  Lidocaine 2% WITH epi Laceration details:    Location:  Trunk   Trunk location:  Upper back   Length (cm):  3   Depth (mm):  6 Treatment:    Area cleansed with:  Saline   Amount of cleaning:  Extensive   Irrigation volume:  1000 mL   Irrigation method:  Syringe   Visualized foreign bodies/material removed: no     Debridement:  None Skin repair:    Repair method:  Sutures   Suture size:  4-0   Suture material:  Prolene   Suture technique:  Horizontal mattress   Number of sutures:  2 Approximation:    Approximation:  Close Repair type:    Repair type:  Intermediate Post-procedure details:    Dressing:  Non-adherent dressing   Procedure completion:  Tolerated .Marland KitchenLaceration Repair  Date/Time: 12/01/2022 3:09 AM  Performed by: Netta Corrigan, PA-C Authorized by: Netta Corrigan, PA-C   Laceration details:    Location:  Trunk   Trunk location:  Upper back   Length (cm):  4   Depth (mm):  5 Pre-procedure details:    Preparation:  Patient was prepped and draped in usual sterile  fashion Treatment:    Area cleansed with:  Saline   Amount of cleaning:  Extensive   Irrigation solution:  Sterile saline   Irrigation volume:  1000 mL   Irrigation method:  Syringe   Visualized foreign bodies/material removed: no     Debridement:  None Skin repair:    Repair method:  Sutures   Suture size:  3-0   Suture material:  Prolene   Suture technique:  Horizontal mattress and simple interrupted   Number of sutures:  3 Approximation:    Approximation:  Close Repair type:    Repair type:  Intermediate Post-procedure details:    Dressing:  Non-adherent dressing   Procedure completion:  Tolerated .Marland KitchenLaceration Repair  Date/Time: 12/01/2022 3:13 AM  Performed by: Netta Corrigan, PA-C Authorized by: Netta Corrigan, PA-C   Laceration details:    Location:  Trunk   Trunk location:  Upper back   Length (cm):  6   Depth (mm):  5 Treatment:    Area cleansed with:  Saline   Amount of cleaning:  Extensive   Irrigation solution:  Sterile saline   Irrigation volume:  1000 mL   Irrigation method:  Syringe   Visualized foreign bodies/material removed: no     Debridement:  None Skin repair:    Repair method:  Sutures   Suture size:  3-0  Suture material:  Prolene   Suture technique:  Horizontal mattress and simple interrupted   Number of sutures:  4 Approximation:    Approximation:  Close Repair type:    Repair type:  Intermediate Post-procedure details:    Dressing:  Non-adherent dressing   Procedure completion:  Tolerated .Marland KitchenLaceration Repair  Date/Time: 12/01/2022 3:15 AM  Performed by: Netta Corrigan, PA-C Authorized by: Netta Corrigan, PA-C   Laceration details:    Location:  Trunk   Trunk location:  Upper back   Length (cm):  4   Depth (mm):  4 Pre-procedure details:    Preparation:  Patient was prepped and draped in usual sterile fashion Treatment:    Area cleansed with:  Saline   Amount of cleaning:  Extensive   Irrigation solution:  Sterile  saline   Irrigation volume:  1000 mL   Irrigation method:  Syringe   Visualized foreign bodies/material removed: no     Debridement:  None Skin repair:    Repair method:  Sutures   Suture size:  3-0   Suture material:  Prolene   Suture technique:  Horizontal mattress and simple interrupted   Number of sutures:  3 Approximation:    Approximation:  Close Repair type:    Repair type:  Intermediate Post-procedure details:    Dressing:  Non-adherent dressing   Procedure completion:  Tolerated .Marland KitchenLaceration Repair  Date/Time: 12/01/2022 3:16 AM  Performed by: Netta Corrigan, PA-C Authorized by: Netta Corrigan, PA-C   Laceration details:    Location:  Trunk   Trunk location:  Upper back   Length (cm):  4   Depth (mm):  5 Pre-procedure details:    Preparation:  Patient was prepped and draped in usual sterile fashion Treatment:    Area cleansed with:  Saline   Amount of cleaning:  Extensive   Irrigation solution:  Sterile saline   Irrigation volume:  1000 mL   Irrigation method:  Syringe   Visualized foreign bodies/material removed: no     Debridement:  None Skin repair:    Repair method:  Sutures   Suture size:  4-0   Suture material:  Prolene   Suture technique:  Horizontal mattress and simple interrupted   Number of sutures:  3 Approximation:    Approximation:  Close Repair type:    Repair type:  Intermediate Post-procedure details:    Dressing:  Non-adherent dressing   Procedure completion:  Tolerated .Marland KitchenLaceration Repair  Date/Time: 12/01/2022 3:17 AM  Performed by: Netta Corrigan, PA-C Authorized by: Netta Corrigan, PA-C   Laceration details:    Location:  Trunk   Trunk location:  Upper back   Length (cm):  2.5   Depth (mm):  5 Pre-procedure details:    Preparation:  Patient was prepped and draped in usual sterile fashion Treatment:    Area cleansed with:  Saline   Amount of cleaning:  Extensive   Irrigation solution:  Sterile saline    Irrigation volume:  1000 mL   Irrigation method:  Syringe   Visualized foreign bodies/material removed: no     Debridement:  None Skin repair:    Repair method:  Sutures   Suture size:  4-0   Suture material:  Prolene   Suture technique:  Horizontal mattress and simple interrupted   Number of sutures:  3 Approximation:    Approximation:  Close Repair type:    Repair type:  Intermediate Post-procedure details:    Dressing:  Non-adherent dressing  Procedure completion:  Tolerated .Marland KitchenLaceration Repair  Date/Time: 12/01/2022 3:18 AM  Performed by: Netta Corrigan, PA-C Authorized by: Netta Corrigan, PA-C   Laceration details:    Location:  Trunk   Trunk location:  Upper back   Length (cm):  4   Depth (mm):  5 Pre-procedure details:    Preparation:  Patient was prepped and draped in usual sterile fashion Treatment:    Area cleansed with:  Saline   Amount of cleaning:  Extensive   Irrigation solution:  Sterile saline   Irrigation volume:  1000 mL   Irrigation method:  Syringe   Debridement:  None Skin repair:    Repair method:  Sutures   Suture size:  3-0   Suture material:  Prolene   Suture technique:  Horizontal mattress and simple interrupted   Number of sutures:  3 Approximation:    Approximation:  Close Repair type:    Repair type:  Intermediate Post-procedure details:    Dressing:  Non-adherent dressing   Procedure completion:  Tolerated .Marland KitchenLaceration Repair  Date/Time: 12/01/2022 3:20 AM  Performed by: Netta Corrigan, PA-C Authorized by: Netta Corrigan, PA-C   Laceration details:    Location:  Trunk   Trunk location:  Upper back   Length (cm):  3   Depth (mm):  5 Treatment:    Area cleansed with:  Saline   Amount of cleaning:  Extensive   Irrigation solution:  Sterile saline   Irrigation volume:  1000 mL   Irrigation method:  Syringe   Visualized foreign bodies/material removed: no     Debridement:  None Skin repair:    Repair method:   Sutures   Suture size:  4-0 and 3-0   Suture material:  Prolene   Suture technique:  Horizontal mattress and simple interrupted   Number of sutures:  3 Approximation:    Approximation:  Close Repair type:    Repair type:  Intermediate Post-procedure details:    Dressing:  Non-adherent dressing   Procedure completion:  Tami Lin, PA-C 12/01/22 0322    Nira Conn, MD 12/01/22 [phone]
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
## 2022-12-01 NOTE — ED Notes (Signed)
Pt asking for po fluids  given  mother remains at  the bedside

## 2022-12-01 NOTE — Plan of Care (Signed)
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
## 2022-12-02 ENCOUNTER — Observation Stay (HOSPITAL_COMMUNITY): Payer: Medicaid Other

## 2022-12-02 ENCOUNTER — Other Ambulatory Visit (HOSPITAL_COMMUNITY): Payer: Self-pay

## 2022-12-02 LAB — MAGNESIUM: Magnesium: 1.7 mg/dL (ref 1.7–2.4)

## 2022-12-02 LAB — CBC
HCT: 25.3 % — ABNORMAL LOW (ref 39.0–52.0)
Hemoglobin: 8.8 g/dL — ABNORMAL LOW (ref 13.0–17.0)
MCH: 32.7 pg (ref 26.0–34.0)
MCHC: 34.8 g/dL (ref 30.0–36.0)
MCV: 94.1 fL (ref 80.0–100.0)
Platelets: 202 10*3/uL (ref 150–400)
RBC: 2.69 MIL/uL — ABNORMAL LOW (ref 4.22–5.81)
RDW: 12.3 % (ref 11.5–15.5)
WBC: 9.9 10*3/uL (ref 4.0–10.5)
nRBC: 0 % (ref 0.0–0.2)

## 2022-12-02 LAB — BASIC METABOLIC PANEL
Anion gap: 9 (ref 5–15)
BUN: 7 mg/dL (ref 6–20)
CO2: 25 mmol/L (ref 22–32)
Calcium: 8 mg/dL — ABNORMAL LOW (ref 8.9–10.3)
Chloride: 103 mmol/L (ref 98–111)
Creatinine, Ser: 1.1 mg/dL (ref 0.61–1.24)
GFR, Estimated: >60 mL/min (ref >60)
Glucose, Bld: 101 mg/dL — ABNORMAL HIGH (ref 70–99)
Potassium: 3.3 mmol/L — ABNORMAL LOW (ref 3.5–5.1)
Sodium: 137 mmol/L (ref 135–145)

## 2022-12-02 MED ORDER — POLYETHYLENE GLYCOL 3350 17 G PO PACK: 17.0000 g | PACK | Freq: Every day | ORAL | 0 refills | Status: AC | PRN

## 2022-12-02 MED ORDER — POTASSIUM CHLORIDE 20 MEQ PO PACK
40.0000 meq | PACK | Freq: Two times a day (BID) | ORAL | Status: DC
  Administered 2022-12-02: 40 meq via ORAL
  Filled 2022-12-02: qty 2

## 2022-12-02 MED ORDER — METHOCARBAMOL 500 MG PO TABS
500.0000 mg | ORAL_TABLET | Freq: Four times a day (QID) | ORAL | 0 refills | Status: AC | PRN
  Filled 2022-12-02: qty 30, 8d supply, fill #0

## 2022-12-02 MED ORDER — ACETAMINOPHEN 325 MG PO TABS: 650.0000 mg | ORAL_TABLET | Freq: Four times a day (QID) | ORAL | Status: AC | PRN

## 2022-12-02 MED ORDER — OXYCODONE HCL 10 MG PO TABS
5.0000 mg | ORAL_TABLET | Freq: Four times a day (QID) | ORAL | 0 refills | Status: AC | PRN
  Filled 2022-12-02: qty 15, 4d supply, fill #0

## 2022-12-02 MED ORDER — POLYETHYLENE GLYCOL 3350 17 G PO PACK
17.0000 g | PACK | Freq: Every day | ORAL | Status: DC
  Administered 2022-12-02: 17 g via ORAL
  Filled 2022-12-02: qty 1

## 2022-12-02 MED ORDER — DOCUSATE SODIUM 100 MG PO CAPS
100.0000 mg | ORAL_CAPSULE | Freq: Two times a day (BID) | ORAL | Status: DC
  Administered 2022-12-02: 100 mg via ORAL
  Filled 2022-12-02: qty 1
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
## 2022-12-02 NOTE — TOC Transition Note (Signed)
Discharge medications (2) are being stored in the main pharmacy on the ground floor until patient is ready for discharge.   
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
## 2022-12-02 NOTE — TOC CAGE-AID Note (Signed)
Transition of Care [facility]) - CAGE-AID Screening   Patient Details  Name: Bobby Hansen MRN: [id number] Date of Birth: 2001-02-23   Hewitt Shorts, RN Trauma Response Nurse Phone Number: [phone] 12/02/2022, 12:45 PM   CAGE-AID Screening:    Have You Ever Felt You Ought to Cut Down on Your Drinking or Drug Use?: No Have People Annoyed You By Critizing Your Drinking Or Drug Use?: No Have You Felt Bad Or Guilty About Your Drinking Or Drug Use?: No Have You Ever Had a Drink or Used Drugs First Thing In The Morning to Steady Your Nerves or to Get Rid of a Hangover?: No CAGE-AID Score: 0  Substance Abuse Education Offered: No (States drink socially only)

## 2022-12-02 NOTE — Progress Notes (Signed)
Pt with orders to d/c home. PIV removed. Assessment and VS documented. Pt is stable. Prescriptions delivered to room by TOC. Discharge education and packet provided. All questions answered. Pt transported to private vehicle via wheelchair.
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
## 2022-12-06 ENCOUNTER — Encounter (HOSPITAL_COMMUNITY): Payer: Self-pay

## 2022-12-07 ENCOUNTER — Ambulatory Visit
Admission: RE | Admit: 2022-12-07 | Discharge: 2022-12-07 | Disposition: A | Discharge status: Home / Self Care | Payer: Medicaid Other | Source: Ambulatory Visit | Attending: Physician Assistant | Admitting: Physician Assistant

## 2022-12-07 DIAGNOSIS — T07XXXA Unspecified multiple injuries, initial encounter: Secondary | ICD-10-CM

## 2022-12-07 DIAGNOSIS — J939 Pneumothorax, unspecified: Secondary | ICD-10-CM

## 2022-12-07 DIAGNOSIS — S36113A Laceration of liver, unspecified degree, initial encounter: Secondary | ICD-10-CM

## 2023-05-28 ENCOUNTER — Other Ambulatory Visit: Payer: Self-pay

## 2023-05-28 ENCOUNTER — Emergency Department (HOSPITAL_COMMUNITY)
Admission: EM | Admit: 2023-05-28 | Discharge: 2023-05-28 | Disposition: A | Discharge status: Home / Self Care | Payer: BLUE CROSS/BLUE SHIELD | Attending: Emergency Medicine | Admitting: Emergency Medicine

## 2023-05-28 ENCOUNTER — Emergency Department (HOSPITAL_COMMUNITY): Payer: BLUE CROSS/BLUE SHIELD

## 2023-05-28 DIAGNOSIS — M25511 Pain in right shoulder: Secondary | ICD-10-CM | POA: Diagnosis present

## 2023-05-28 DIAGNOSIS — X509XXA Other and unspecified overexertion or strenuous movements or postures, initial encounter: Secondary | ICD-10-CM | POA: Insufficient documentation

## 2023-05-28 DIAGNOSIS — S43004A Unspecified dislocation of right shoulder joint, initial encounter: Secondary | ICD-10-CM | POA: Insufficient documentation

## 2023-05-28 MED ORDER — OXYCODONE-ACETAMINOPHEN 5-325 MG PO TABS
1.0000 | ORAL_TABLET | Freq: Once | ORAL | Status: AC
  Administered 2023-05-28: 1 via ORAL
  Filled 2023-05-28: qty 1

## 2023-05-28 NOTE — ED Provider Notes (Signed)
[place] EMERGENCY DEPARTMENT AT [facility] Provider Note   CSN: [id number] Arrival date & time: 05/28/23  4098     History  Chief Complaint  Patient presents with   dislocated shoulsder    Bobby Hansen is a 22 y.o. male.  HPI Patient presents for right shoulder pain.  He underwent surgery to his right shoulder following dislocation approximately 5 years ago.  He states that his right shoulder has been dislocated "countless" times.  This morning, he woke in his normal state of health.  Shortly after he woke up, he stretched and felt his shoulder pop out of joint.      Home Medications Prior to Admission medications   Medication Sig Start Date End Date Taking? Authorizing Provider  acetaminophen (TYLENOL) 325 MG tablet Take 2 tablets (650 mg total) by mouth every 6 (six) hours as needed for mild pain. 12/02/22   Meuth, Brooke A, PA-C  CHLOROPHYLL PO Take 1 tablet by mouth daily as needed (digestive health).    [provider]  cyclobenzaprine (FLEXERIL) 10 MG tablet Take 1 tablet (10 mg total) by mouth 3 (three) times daily as needed for muscle spasms. 03/22/18   Shuford, French Ana, PA-C  methocarbamol (ROBAXIN) 500 MG tablet Take 1 tablet (500 mg total) by mouth 2 (two) times daily. 11/05/19   Joy, Shawn C, PA-C  methocarbamol (ROBAXIN) 500 MG tablet Take 1 tablet (500 mg total) by mouth every 6 (six) hours as needed for muscle spasms. 12/02/22   Meuth, Brooke A, PA-C  naproxen (NAPROSYN) 500 MG tablet Take 1 tablet (500 mg total) by mouth 2 (two) times daily with a meal. 03/22/18   Shuford, French Ana, PA-C  omeprazole (PRILOSEC) 20 MG capsule Take 1 capsule (20 mg total) by mouth daily. 02/25/18   Georgetta Haber, NP  Oxycodone HCl 10 MG TABS Take 0.5-1 tablets (5-10 mg total) by mouth every 6 (six) hours as needed for severe pain. 12/02/22   Meuth, Lina Sar, PA-C  oxyCODONE-acetaminophen (PERCOCET) 5-325 MG tablet Take 1 tablet by mouth every 4 (four) hours as needed.  03/22/18   Shuford, French Ana, PA-C  polyethylene glycol (MIRALAX / GLYCOLAX) 17 g packet Take 17 g by mouth daily as needed for mild constipation. 12/02/22   Meuth, Lina Sar, PA-C      Allergies    Patient has no known allergies.    Review of Systems   Review of Systems  Musculoskeletal:  Positive for arthralgias.  All other systems reviewed and are negative.   Physical Exam Updated Vital Signs BP 125/81 (BP Location: Left Arm)   Pulse 86   Temp 98.2 F (36.8 C) (Oral)   Resp 18   Ht 6' (1.829 m)   Wt 88.9 kg   SpO2 100%   BMI 26.58 kg/m  Physical Exam Vitals and nursing note reviewed.  Constitutional:      General: He is not in acute distress.    Appearance: Normal appearance. He is well-developed. He is not ill-appearing, toxic-appearing or diaphoretic.  HENT:     Head: Normocephalic and atraumatic.     Right Ear: External ear normal.     Left Ear: External ear normal.     Nose: Nose normal.     Mouth/Throat:     Mouth: Mucous membranes are moist.  Eyes:     Extraocular Movements: Extraocular movements intact.     Conjunctiva/sclera: Conjunctivae normal.  Cardiovascular:     Rate and Rhythm: Normal rate and regular  rhythm.  Pulmonary:     Effort: Pulmonary effort is normal. No respiratory distress.  Abdominal:     General: There is no distension.     Palpations: Abdomen is soft.  Musculoskeletal:        General: Deformity present.     Cervical back: Normal range of motion and neck supple.     Comments: Right arm held with shoulder abduction and elbow flexion.  Skin:    General: Skin is warm and dry.     Coloration: Skin is not jaundiced or pale.  Neurological:     General: No focal deficit present.     Mental Status: He is alert and oriented to person, place, and time.  Psychiatric:        Mood and Affect: Mood normal.        Behavior: Behavior normal.        Thought Content: Thought content normal.        Judgment: Judgment normal.     ED Results /  Procedures / Treatments   Labs (all labs ordered are listed, but only abnormal results are displayed) Labs Reviewed - No data to display  EKG None  Radiology DG Shoulder Right  Result Date: 05/28/2023 CLINICAL DATA:  Acute right shoulder pain. EXAM: RIGHT SHOULDER - 2+ VIEW COMPARISON:  November 05, 2019. FINDINGS: There is no evidence of fracture or dislocation. There is no evidence of arthropathy or other focal bone abnormality. Soft tissues are unremarkable. IMPRESSION: Negative. Electronically Signed   By: Lupita Raider M.D.   On: 05/28/2023 10:05    Procedures Procedures    Medications Ordered in ED Medications  oxyCODONE-acetaminophen (PERCOCET/ROXICET) 5-325 MG per tablet 1 tablet (1 tablet Oral Given 05/28/23 5621)    ED Course/ Medical Decision Making/ A&P                             Medical Decision Making Amount and/or Complexity of Data Reviewed Radiology: ordered.  Risk Prescription drug management.   Patient presents for right shoulder discomfort and limited range of motion.  This is consistent with his prior episodes of shoulder dislocation.  On arrival, patient has right arm held at 90 degrees abduction and elbow flexed.  He declines any pain medication initially.  X-ray imaging was ordered.  Patient subsequently requested pain medication of Percocet was ordered.  While undergoing x-ray, shoulder reduced on its own.  Imaging confirms reduction.  Sling was provided.  Patient was discharged in good condition.        Final Clinical Impression(s) / ED Diagnoses Final diagnoses:  Closed dislocation of right shoulder, initial encounter    Rx / DC Orders ED Discharge Orders     None         Gloris Manchester, MD 05/28/23 1010

## 2023-05-28 NOTE — Discharge Instructions (Addendum)
Take ibuprofen and Tylenol as needed for soreness.  Wear sling to avoid further dislocations.  Follow-up with your orthopedic doctor.
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
## 2024-03-18 ENCOUNTER — Other Ambulatory Visit (HOSPITAL_COMMUNITY): Payer: Self-pay
